# Patient Record
Sex: Male | Born: 1960 | Race: White | Hispanic: No | Marital: Single
Health system: Southern US, Community
[De-identification: ages and names within clinical notes are randomized; demographics above are authoritative.]

---

## 2014-05-04 ENCOUNTER — Inpatient Hospital Stay: Payer: Self-pay | Admitting: Specialist

## 2014-05-04 LAB — CBC
HCT: 39.4 % — ABNORMAL LOW (ref 40.0–52.0)
HGB: 13.4 g/dL (ref 13.0–18.0)
MCH: 33.2 pg (ref 26.0–34.0)
MCHC: 34 g/dL (ref 32.0–36.0)
MCV: 98 fL (ref 80–100)
Platelet: 194 10*3/uL (ref 150–440)
RBC: 4.03 10*6/uL — ABNORMAL LOW (ref 4.40–5.90)
RDW: 13.8 % (ref 11.5–14.5)
WBC: 7.9 10*3/uL (ref 3.8–10.6)

## 2014-05-04 LAB — DRUG SCREEN, URINE
Amphetamines, Ur Screen: NEGATIVE (ref ?–1000)
BENZODIAZEPINE, UR SCRN: NEGATIVE (ref ?–200)
Barbiturates, Ur Screen: NEGATIVE (ref ?–200)
CANNABINOID 50 NG, UR ~~LOC~~: NEGATIVE (ref ?–50)
COCAINE METABOLITE, UR ~~LOC~~: NEGATIVE (ref ?–300)
MDMA (Ecstasy)Ur Screen: NEGATIVE (ref ?–500)
METHADONE, UR SCREEN: NEGATIVE (ref ?–300)
OPIATE, UR SCREEN: NEGATIVE (ref ?–300)
Phencyclidine (PCP) Ur S: NEGATIVE (ref ?–25)
TRICYCLIC, UR SCREEN: NEGATIVE (ref ?–1000)

## 2014-05-04 LAB — BASIC METABOLIC PANEL
ANION GAP: 5 — AB (ref 7–16)
BUN: 10 mg/dL (ref 7–18)
CREATININE: 1 mg/dL (ref 0.60–1.30)
Calcium, Total: 8.8 mg/dL (ref 8.5–10.1)
Chloride: 101 mmol/L (ref 98–107)
Co2: 29 mmol/L (ref 21–32)
EGFR (Non-African Amer.): >60
Glucose: 131 mg/dL — ABNORMAL HIGH (ref 65–99)
Osmolality: 271 (ref 275–301)
POTASSIUM: 4 mmol/L (ref 3.5–5.1)
Sodium: 135 mmol/L — ABNORMAL LOW (ref 136–145)

## 2014-05-04 LAB — URINALYSIS, COMPLETE
BACTERIA: NONE SEEN
Bilirubin,UR: NEGATIVE
Blood: NEGATIVE
Glucose,UR: NEGATIVE mg/dL (ref 0–75)
KETONE: NEGATIVE
Leukocyte Esterase: NEGATIVE
Nitrite: NEGATIVE
PROTEIN: NEGATIVE
Ph: 5 (ref 4.5–8.0)
RBC, UR: NONE SEEN /HPF (ref 0–5)
SPECIFIC GRAVITY: 1.004 (ref 1.003–1.030)
Squamous Epithelial: NONE SEEN

## 2014-05-04 LAB — TROPONIN I
TROPONIN-I: 0.16 ng/mL — AB
Troponin-I: 0.1 ng/mL — ABNORMAL HIGH

## 2014-05-05 LAB — LIPID PANEL
Cholesterol: 197 mg/dL (ref 0–200)
HDL: 68 mg/dL — AB (ref 40–60)
Ldl Cholesterol, Calc: 107 mg/dL — ABNORMAL HIGH (ref 0–100)
Triglycerides: 110 mg/dL (ref 0–200)
VLDL Cholesterol, Calc: 22 mg/dL (ref 5–40)

## 2014-05-05 LAB — TROPONIN I: Troponin-I: 0.14 ng/mL — ABNORMAL HIGH

## 2014-05-06 LAB — CBC WITH DIFFERENTIAL/PLATELET
BASOS PCT: 0.7 %
Basophil #: 0 10*3/uL (ref 0.0–0.1)
Eosinophil #: 0.2 10*3/uL (ref 0.0–0.7)
Eosinophil %: 3.9 %
HCT: 40.9 % (ref 40.0–52.0)
HGB: 13.9 g/dL (ref 13.0–18.0)
Lymphocyte #: 2.3 10*3/uL (ref 1.0–3.6)
Lymphocyte %: 37.1 %
MCH: 33.2 pg (ref 26.0–34.0)
MCHC: 34.1 g/dL (ref 32.0–36.0)
MCV: 97 fL (ref 80–100)
MONOS PCT: 11.2 %
Monocyte #: 0.7 x10 3/mm (ref 0.2–1.0)
NEUTROS ABS: 2.9 10*3/uL (ref 1.4–6.5)
Neutrophil %: 47.1 %
PLATELETS: 186 10*3/uL (ref 150–440)
RBC: 4.2 10*6/uL — ABNORMAL LOW (ref 4.40–5.90)
RDW: 13.6 % (ref 11.5–14.5)
WBC: 6.2 10*3/uL (ref 3.8–10.6)

## 2014-05-06 LAB — BASIC METABOLIC PANEL
Anion Gap: 7 (ref 7–16)
BUN: 10 mg/dL (ref 7–18)
CALCIUM: 8.7 mg/dL (ref 8.5–10.1)
CHLORIDE: 103 mmol/L (ref 98–107)
CO2: 28 mmol/L (ref 21–32)
CREATININE: 0.89 mg/dL (ref 0.60–1.30)
EGFR (African American): >60
EGFR (Non-African Amer.): >60
Glucose: 99 mg/dL (ref 65–99)
OSMOLALITY: 275 (ref 275–301)
Potassium: 3.9 mmol/L (ref 3.5–5.1)
Sodium: 138 mmol/L (ref 136–145)

## 2014-10-17 NOTE — Discharge Summary (Signed)
PATIENT NAME:  Bryce SchwalbeLORENZO, Jhayden MR#:  [id number] DATE OF BIRTH:  1961/04/03  DATE OF ADMISSION:  05/04/2014 DATE OF DISCHARGE:    [facility] upon [facility] availability.   CHIEF COMPLAINT: Chest pain.   PRESENT DIAGNOSES:  1.  Acute non-Q-wave myocardial infarction with abnormal stress test and acute non-Q-wave myocardial infarction.  2.  Mild ischemic cardiomyopathy with ejection fraction of around 50% per cardiac catheterization.  3.  Three-vessel severe disease.  4.  Chronic smoker.   PROCEDURES: Cardiac catheterization shows 3-vessel CAD with occluded left circumflex, 99% stenosis proximal LAD and long 75% stenosis mid large dominant RCA. EF around 50%.   Basic metabolic panel within normal limits.   CBC within normal limits.   Lipid profile: LDL is 107, HDL is 68. Troponin is 0.16. UA is negative for UTI. Urine drug screen negative.    BRIEF SUMMARY OF HOSPITAL COURSE:  1.  [name] is a 54 year old Caucasian gentleman with no significant past medical history, comes in with chest pain. He was admitted on the medical floor, started on aspirin, statins, Nitro-Bid ointment along with Lovenox. His troponins were borderly elevated. He underwent Myoview stress test, which was borderline negative with possible scarring. Given his symptoms along with positive troponins, the patient underwent cardiac catheterization, shows 3-vessel severe coronary artery disease. The patient's cardiologist, Dr. Darrold JunkerParaschos, has made arrangements for the patient to be transferred [facility]. The patient has been accepted by Dr. Bernette RedbirdKevin Harrison at [facility]. We will continue his statins, aspirin, beta blockers and nitroglycerin sublingual as needed.  2.  Hyperlipidemia. Already, the patient is on statins.  3.  Mild ischemic cardiomyopathy. Continue above present treatment.  4.  Nicotine dependence. The patient was given nicotine patch.  5.   Alcohol abuse. The patient is being watched for withdrawal. No signs or symptoms of withdrawal noted so far.  CODE STATUS: He remained a full code.   TIME SPENT: Forty minutes.   ____________________________ Wylie HailSona A. Allena KatzPatel, MD sap:TT D: 05/06/2014 13:54:48 ET T: 05/06/2014 15:29:07 ET JOB#: [id number]  cc: Kateland Leisinger A. Allena KatzPatel, MD, <Dictator> Willow OraSONA A Kieryn Burtis MD ELECTRONICALLY SIGNED 05/28/2014 14:03

## 2014-10-17 NOTE — Consult Note (Signed)
PATIENT NAME:  Bryce SchwalbeLORENZO, July MR#:  [id number] DATE OF BIRTH:  1960-11-03  DATE OF CONSULTATION:  05/05/2014  CONSULTING PHYSICIAN:  Marcina MillardAlexander Clester Chlebowski, MD  PRIMARY CARE PROVIDER:  None.    CHIEF COMPLAINT: Chest discomfort.   HISTORY OF PRESENT ILLNESS: The patient is a 54 year old gentleman referred for evaluation of chest pain. The patient reports a 2-week history of intermittent episodes of chest discomfort.  He describes substernal chest discomfort with some radiation to his back which appeared to occur with exertion.  The patient does a lot of traveling and [place] [facility] and noted episodes of chest discomfort.  [place] and [facility] where EKG was unremarkable.  The patient had borderline elevated troponin without peak or trough of 0.1.  EKG was nondiagnostic.   PAST MEDICAL HISTORY:  1.  Tobacco abuse.  2.  Alcohol abuse. The patient and drinks a 12 pack of beer every day.   MEDICATIONS: None.   SOCIAL HISTORY: The patient smokes 2 packs of cigarettes a day and a 12 pack of beer a day.   FAMILY HISTORY: Mother, apparently died status post myocardial infarction at age 947.   REVIEW OF SYSTEMS:  CONSTITUTIONAL: No fever or chills.  EYES: No blurry vision.  EARS: No hearing loss.  RESPIRATORY: No shortness of breath.  CARDIOVASCULAR: Chest discomfort as described above.  GASTROINTESTINAL: No nausea, vomiting or diarrhea.  GENITOURINARY: No dysuria or hematuria.  ENDOCRINE: No polyuria or polydipsia.  MUSCULOSKELETAL: No arthralgias or myalgias.  NEUROLOGICAL: No focal muscle weakness or numbness.  PSYCHOLOGICAL: No depression or anxiety.   PHYSICAL EXAMINATION:  VITAL SIGNS: Blood pressure 134/80, pulse 83, respirations 18, temperature 97.9, pulse oximetry 98%.  HEENT: Pupils equal, reactive to light and accommodation.  NECK: Supple without thyromegaly.  LUNGS: Clear.  HEART: Normal JVP. Normal PMI. Regular rate and  rhythm. Normal S1, S2. No appreciable gallop, murmur, or rub.  ABDOMEN: Soft and nontender. Pulses were intact bilaterally.  MUSCULOSKELETAL: Normal muscle tone.  NEUROLOGIC: The patient is alert and oriented x 3. Motor and sensory both grossly intact.   IMPRESSION: A 54 year old gentleman with tobacco abuse, alcohol abuse with a 2-week history of exertional chest discomfort with borderline elevated troponin.  EKG is nondiagnostic.   RECOMMENDATIONS:  1.  Agree with overall current therapy.  2.  Would defer full dose anticoagulation at this time. 3.  Agree with ETT sestamibi study today.  4.  Further recommendations pending ETT sestamibi study results.     ____________________________ Marcina MillardAlexander Annice Jolly, MD ap:DT D: 05/05/2014 08:15:14 ET T: 05/05/2014 08:44:16 ET JOB#: [id number]  cc: Marcina MillardAlexander Oaklyn Mans, MD, <Dictator> Marcina MillardALEXANDER Adele Milson MD ELECTRONICALLY SIGNED 05/08/2014 8:41

## 2014-10-17 NOTE — H&P (Signed)
PATIENT NAME:  Karie SchwalbeLORENZO, Tashi MR#:  [id number] DATE OF BIRTH:  05-16-1961  DATE OF ADMISSION:  05/04/2014  PRIMARY CARE PHYSICIAN: Does not have one.   CHIEF COMPLAINT: Chest pain.   HISTORY OF PRESENT ILLNESS: This is a 54 year old male who presents to the hospital complaining of intermittent chest pain, ongoing for the past 2 weeks. The patient describes his pain as a pressure in nature, located in the center of his chest, sometimes radiating to his back, it improves with certain movements if he tenses his neck or her back muscles according to him. He admits to some diaphoresis, intermittently with his chest pain. His chest pain has been worse with exertion and improves with rest. He also complains of shortness of breath along with it but no nausea, no vomiting, no palpitations, no syncope. The patient has had never had a previous MI or CVA but does have a strong history of tobacco abuse. Given the fact that his symptoms have been progressively getting worse, he came to the ER for further evaluation; in the Emergency Room, the patient's troponin was noted to be mildly elevated at 0.1. His EKG did show LVH with voltage criteria, but no other acute ST changes. Hospitalist services were contacted for further treatment and evaluation.   REVIEW OF SYSTEMS:   CONSTITUTIONAL:  No documented fever, no weight gain. No weight loss.  EYES: No blurred or double vision.  ENT: No tinnitus. No postnasal drip. No redness of the oropharynx.  RESPIRATORY: No cough. No wheeze. No hemoptysis. No dyspnea.  CARDIOVASCULAR: Positive chest pain. No orthopnea. No palpitations. No syncope.  GASTROINTESTINAL: No nausea, no vomiting. No diarrhea. No abdominal pain. No melena or hematochezia.  GENITOURINARY: No dysuria and hematuria.  ENDOCRINE: No polyuria or nocturia. No heat or cold intolerance.  HEMATOLOGIC: No anemia, no bruising, no bleeding.  INTEGUMENTARY: No rashes. No lesions.  MUSCULOSKELETAL: No arthritis. No  swelling. No gout.  NEUROLOGIC: No numbness, tingling. No ataxia. No seizure-type activity.  PSYCHIATRIC: No anxiety, no insomnia, no ADD disorder.   PAST MEDICAL HISTORY: History of tobacco abuse and ongoing alcohol abuse.   SOCIAL HISTORY: Does smoke about 2 packs per day, has been smoking for the past 30 to 40 years; also drinks about a 12 pack daily, last drink being this afternoon; no other illicit drug abuse, lives by himself.   FAMILY HISTORY: Mother and father both deceased. Mother died from complications of MI  at age 56; father died in the 2380s due to a brain tumor.   CURRENT MEDICATIONS: He is currently on no medications.   PHYSICAL EXAMINATION:  VITAL SIGNS: Presently is as follows, are noted to be temperature is 97.6, pulse 89, respirations 18, blood pressure 149/91, sats 98% on room air.  GENERAL: He is a pleasant -appearing male, slightly unkempt but in no apparent distress.  HEAD AND EYES AND EARS AND NOSE AND THROAT: Atraumatic, normocephalic, extraocular muscles are intact; pupils equal and reactive to light, sclerae anicteric, no conjunctival injection. No pharyngeal erythema, poor oral dentition.  NECK: Supple. There is no jugular venous distention. No bruits, no lymphadenopathy, no thyromegaly.  HEART: Regular rate and rhythm. No murmurs, no rubs, no clicks.  LUNGS: He has some course end expiratory wheezing, otherwise negative use of accessory muscles. No dullness to percussion. No rales or rhonchi.  ABDOMEN: Soft, flat, nontender, nondistended. He has good bowel sounds. No hepatosplenomegaly appreciated.  EXTREMITIES: No evidence of any cyanosis, clubbing, or peripheral edema, has +2 pedal and radial  pulses bilaterally.  NEUROLOGIC: The patient is alert, awake, and oriented x 3, with no focal motor or sensory defecits bilaterally.  SKIN: Moist and warm with no rashes appreciated.  LYMPHATIC: There is no cervical or axillary lymphadenopathy.   LABORATORY: Shows serum  glucose 131, BUN 10, creatinine 1, sodium 135, potassium 4, chloride 101; bicarbonate 29; the patient's troponin 0.1, urine toxicology is negative, white cell count 7.9, hemoglobin 13.4, hematocrit 39.4, platelet count 194,000, urinalysis within normal limits.   The patient did have a chest x-ray done which showed no evidence of acute cardiopulmonary disease. EKG shows left ventricular hypertrophy with voltage criteria but no acute ST or T wave changes.   ASSESSMENT AND PLAN: This is a 54 year old male with no significant past medical history but long history of tobacco abuse and alcohol abuse who presents to the hospital with intermittent chest pain, ongoing for the past 2 weeks and noted to have elevated troponin.  Problem: 1.  Chest pain/suspected non-ST elevation myocardial infarction. The patient's symptoms are suspicious for angina and he does have significant risk factors given his long history of tobacco abuse and family history. His EKG although does not show any acute ST or T wave changes. He is currently chest pain-free. For now observe him on telemetry, follow serial cardiac markers, continue aspirin, add some low-dose beta blocker, also start him on Lovenox as needed, nitroglycerin and some morphine and oxygen. We will get a cardiology consult and discuss the case with  Dr. Darrold Junker who will see the patient tomorrow. At this point, he plans on pursuing a stress test unless his cardiac markers turn significantly higher and positive for an acute non ST elevation myocardial infarction, then he likely will need a catheterization.  2.  Hypertension. The patient has no previous history of hypertension, but his EKG does show left ventricular hypertrophy with voltage criteria and his blood pressure is currently elevated. I would start him on low-dose beta blockers with metoprolol and add some as needed hydralazine.  3.  Nicotine dependence. Place him on a nicotine patch.  4.  Alcohol abuse. The  patient has high risk for withdrawal but had a drink today, consider starting on CIWA if needed.   The patient is a full code.   TIME SPENT ON ADMISSION: Was 45 minutes.    ____________________________ Rolly Pancake. Cherlynn Kaiser, MD vjs:nt D: 05/04/2014 18:02:05 ET T: 05/04/2014 18:56:41 ET JOB#: [id number]  cc: Rolly Pancake. Cherlynn Kaiser, MD, <Dictator> Houston Siren MD ELECTRONICALLY SIGNED 05/15/2014 12:39

## 2016-05-22 IMAGING — CR DG CHEST 1V PORT
1 series · 1 of 1 positions shown · non-contrast
Comparison: None.

CLINICAL DATA: Midsternal chest pain and shortness of breath 1
week.

EXAM:
PORTABLE CHEST - 1 VIEW

[ap]
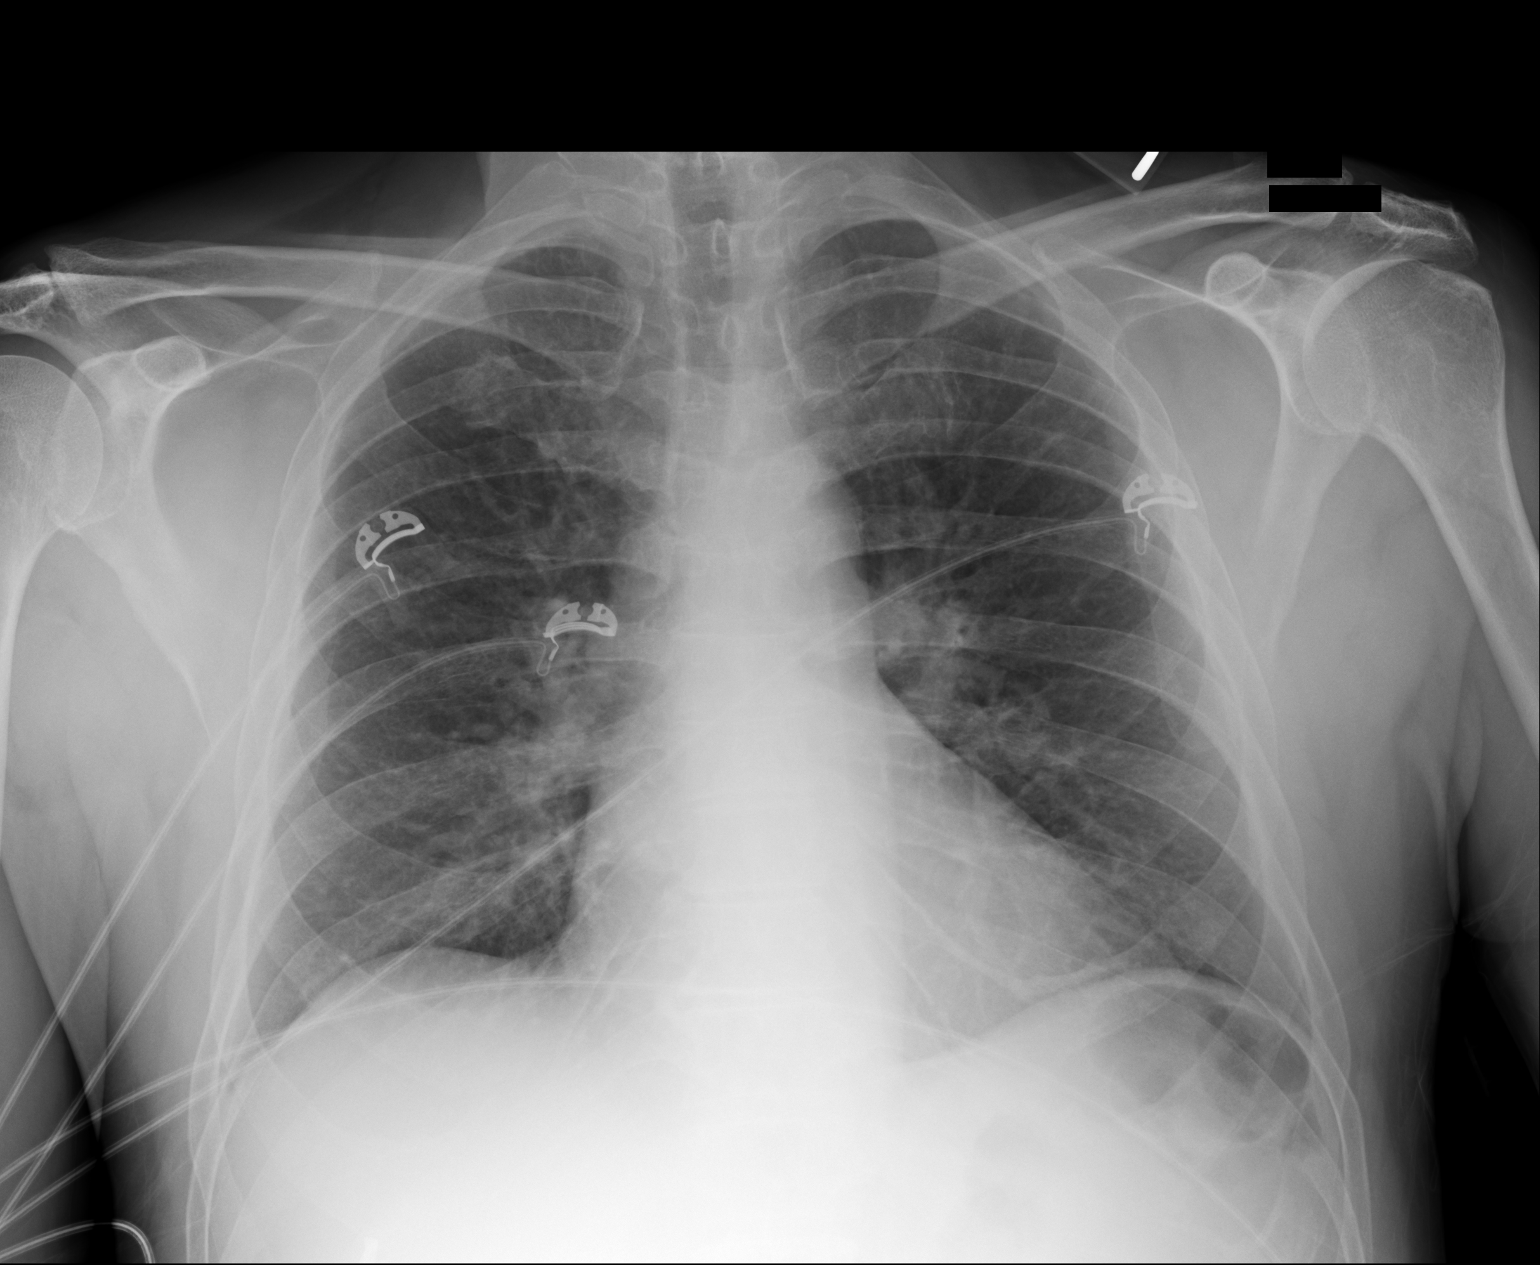

[1 of 1 positions shown; findings below may reference images not displayed]

FINDINGS: Lungs are adequately inflated without consolidation or effusion.
Cardiomediastinal silhouette and remainder the exam is unremarkable.
IMPRESSION: No active disease.
